# Patient Record
Sex: Female | Born: 1970 | Race: White | Hispanic: No | Marital: Single
Health system: Southern US, Community
[De-identification: ages and names within clinical notes are randomized; demographics above are authoritative.]

---

## 2014-09-26 ENCOUNTER — Encounter (HOSPITAL_COMMUNITY): Payer: Self-pay | Admitting: Emergency Medicine

## 2014-09-26 ENCOUNTER — Emergency Department (HOSPITAL_COMMUNITY)
Admission: EM | Admit: 2014-09-26 | Discharge: 2014-09-26 | Disposition: A | Discharge status: Home / Self Care | Payer: No Typology Code available for payment source | Source: Home / Self Care | Attending: Emergency Medicine | Admitting: Emergency Medicine

## 2014-09-26 DIAGNOSIS — J069 Acute upper respiratory infection, unspecified: Secondary | ICD-10-CM

## 2014-09-26 DIAGNOSIS — B9789 Other viral agents as the cause of diseases classified elsewhere: Principal | ICD-10-CM

## 2014-09-26 MED ORDER — AMOXICILLIN-POT CLAVULANATE 875-125 MG PO TABS: 1.0000 | ORAL_TABLET | Freq: Two times a day (BID) | ORAL | Status: AC

## 2014-09-26 MED ORDER — IPRATROPIUM BROMIDE 0.06 % NA SOLN: 2.0000 | Freq: Four times a day (QID) | NASAL | Status: AC

## 2014-09-26 MED ORDER — CETIRIZINE HCL 10 MG PO CAPS: 10.0000 mg | ORAL_CAPSULE | Freq: Every day | ORAL | Status: AC

## 2014-09-26 MED ORDER — FLUTICASONE PROPIONATE 50 MCG/ACT NA SUSP: 2.0000 | Freq: Every day | NASAL | Status: AC

## 2014-09-26 NOTE — ED Provider Notes (Signed)
CSN: [id number]     Arrival date & time 09/26/14  1534 History   First MD Initiated Contact with Patient 09/26/14 1631     Chief Complaint  Patient presents with  . Cough   (Consider location/radiation/quality/duration/timing/severity/associated sxs/prior Treatment) HPI She is a 43 year old woman here for evaluation of cough and sore throat. Her symptoms started about 4 days ago and has gradually worsened. She describes nasal congestion and rhinorrhea. Some mild sinus pressure. She reports feeling like her ears are draining, but denies ear pain. She prescription a sore throat, increased pain with swallowing. She is tolerating liquids well. She denies any shortness of breath, fevers, chills, chest pain. She states this typically turns into a sinus infection.  History reviewed. No pertinent past medical history. History reviewed. No pertinent past surgical history. No family history on file. History  Substance Use Topics  . Smoking status: Not on file  . Smokeless tobacco: Not on file  . Alcohol Use: Not on file   OB History    No data available     Review of Systems  Constitutional: Negative for fever and chills.  HENT: Positive for congestion, rhinorrhea, sinus pressure and sore throat. Negative for ear pain and trouble swallowing.   Respiratory: Positive for cough. Negative for shortness of breath.   Cardiovascular: Negative for chest pain.  Gastrointestinal: Negative for nausea, vomiting and abdominal pain.  Neurological: Negative for headaches.    Allergies  Review of patient's allergies indicates no known allergies.  Home Medications   Prior to Admission medications   Medication Sig Start Date End Date Taking? Authorizing Provider  amoxicillin-clavulanate (AUGMENTIN) 875-125 MG per tablet Take 1 tablet by mouth 2 (two) times daily. 09/26/14   Charm RingsErin J Honig, MD  Cetirizine HCl 10 MG CAPS Take 1 capsule (10 mg total) by mouth daily. 09/26/14   Charm RingsErin J Honig, MD  fluticasone  (FLONASE) 50 MCG/ACT nasal spray Place 2 sprays into both nostrils daily. 09/26/14   Charm RingsErin J Honig, MD  ipratropium (ATROVENT) 0.06 % nasal spray Place 2 sprays into both nostrils 4 (four) times daily. 09/26/14   Charm RingsErin J Honig, MD   BP 139/95 mmHg  Pulse 67  Temp(Src) 98.2 F (36.8 C) (Oral)  Resp 18  SpO2 97%  LMP 09/07/2014 Physical Exam  Constitutional: She is oriented to person, place, and time. She appears well-developed and well-nourished. No distress.  HENT:  Head: Normocephalic and atraumatic.  Right Ear: External ear normal.  Left Ear: External ear normal.  Nose: Mucosal edema and rhinorrhea present. Right sinus exhibits no maxillary sinus tenderness and no frontal sinus tenderness. Left sinus exhibits no maxillary sinus tenderness and no frontal sinus tenderness.  Mouth/Throat: Oropharynx is clear and moist. No oropharyngeal exudate.  Eyes: Conjunctivae are normal.  Neck: Neck supple.  Cardiovascular: Normal rate, regular rhythm and normal heart sounds.   No murmur heard. Pulmonary/Chest: Effort normal and breath sounds normal. No respiratory distress. She has no wheezes. She has no rales.  Lymphadenopathy:    She has no cervical adenopathy.  Neurological: She is alert and oriented to person, place, and time.    ED Course  Procedures (including critical care time) Labs Review Labs Reviewed - No data to display  Imaging Review No results found.   MDM   1. Viral URI with cough    We'll treat symptomatically with Zyrtec, Flonase, Atrovent nasal spray. Also recommended nasal saline spray. Prescription provided for Augmentin, to be filled only if she is not improving  by Saturday. Follow-up as needed.    Charm RingsErin J Honig, MD 09/26/14 [phone]

## 2014-09-26 NOTE — ED Notes (Signed)
[age] year old female here today with c/o congestion  Bad cough and bad sorethroat for past 4 days

## 2014-09-26 NOTE — Discharge Instructions (Signed)
You have a virus. Take cetirizine 1 pill daily for the next week. Use flonase daily for the next week. Use atrovent 4 times a day for the next week. If you are not improving by Saturday, fill the prescription for Augmentin (antibiotic). Follow up as needed.

## 2015-09-14 ENCOUNTER — Other Ambulatory Visit (HOSPITAL_COMMUNITY)
Admission: RE | Admit: 2015-09-14 | Discharge: 2015-09-14 | Disposition: A | Discharge status: Home / Self Care | Payer: 59 | Source: Ambulatory Visit | Attending: Family Medicine | Admitting: Family Medicine

## 2015-09-14 ENCOUNTER — Other Ambulatory Visit: Payer: Self-pay | Admitting: Family Medicine

## 2015-09-14 DIAGNOSIS — Z01419 Encounter for gynecological examination (general) (routine) without abnormal findings: Secondary | ICD-10-CM | POA: Insufficient documentation

## 2015-09-18 LAB — CYTOLOGY - PAP

## 2018-01-22 ENCOUNTER — Other Ambulatory Visit (HOSPITAL_COMMUNITY)
Admission: RE | Admit: 2018-01-22 | Discharge: 2018-01-22 | Disposition: A | Discharge status: Home / Self Care | Payer: BLUE CROSS/BLUE SHIELD | Source: Ambulatory Visit | Attending: Family Medicine | Admitting: Family Medicine

## 2018-01-22 ENCOUNTER — Other Ambulatory Visit: Payer: Self-pay | Admitting: Family Medicine

## 2018-01-22 DIAGNOSIS — Z0000 Encounter for general adult medical examination without abnormal findings: Secondary | ICD-10-CM | POA: Insufficient documentation

## 2018-01-27 LAB — CYTOLOGY - PAP
Diagnosis: NEGATIVE
HPV (WINDOPATH): NOT DETECTED

## 2021-07-22 ENCOUNTER — Other Ambulatory Visit: Payer: Self-pay | Admitting: Family Medicine

## 2021-07-22 ENCOUNTER — Other Ambulatory Visit: Payer: BLUE CROSS/BLUE SHIELD

## 2021-07-22 DIAGNOSIS — R52 Pain, unspecified: Secondary | ICD-10-CM

## 2021-07-23 ENCOUNTER — Ambulatory Visit
Admission: RE | Admit: 2021-07-23 | Discharge: 2021-07-23 | Disposition: A | Discharge status: Home / Self Care | Payer: Self-pay | Source: Ambulatory Visit | Attending: Family Medicine | Admitting: Family Medicine

## 2021-07-23 ENCOUNTER — Other Ambulatory Visit: Payer: Self-pay

## 2021-07-23 DIAGNOSIS — R52 Pain, unspecified: Secondary | ICD-10-CM

## 2022-06-28 IMAGING — CR DG KNEE 3 VIEWS*L*
3 series · 3 of 3 positions shown · non-contrast
Comparison: None.

CLINICAL DATA: Pain. Patient reports medial left knee pain for
several weeks.

EXAM:
LEFT KNEE - 3 VIEW

[w knee ap left]
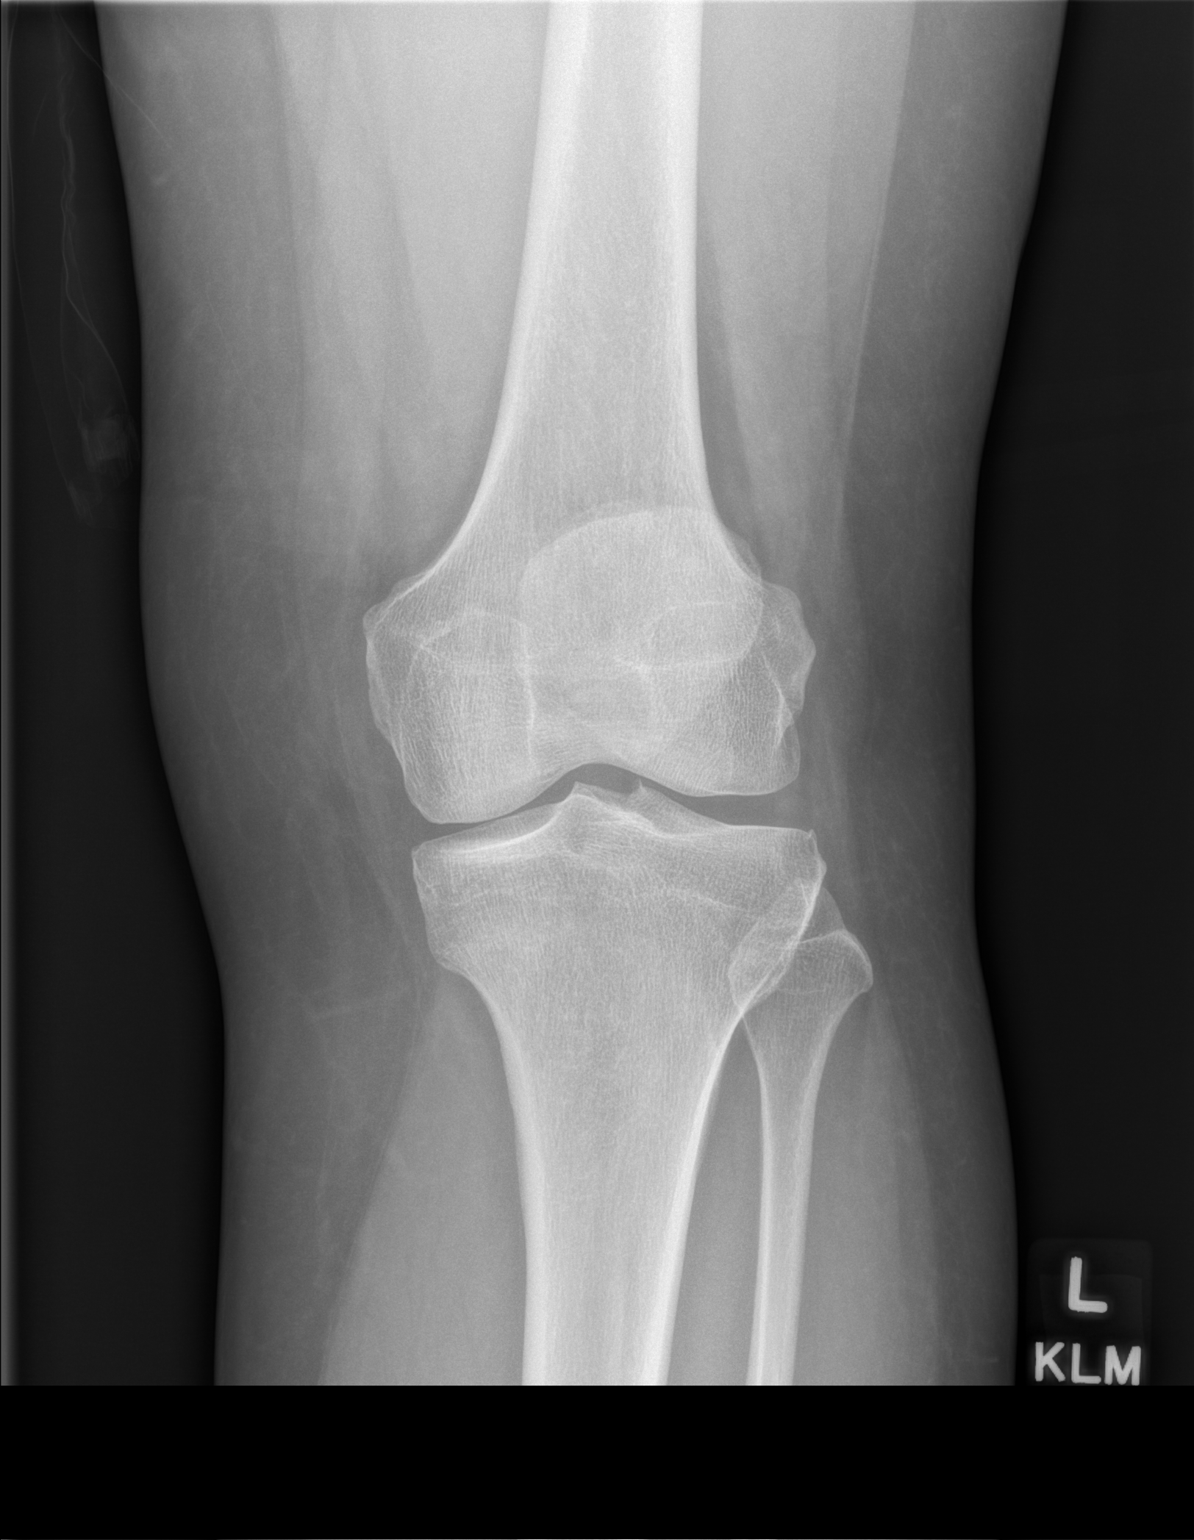

[w knee lat left]
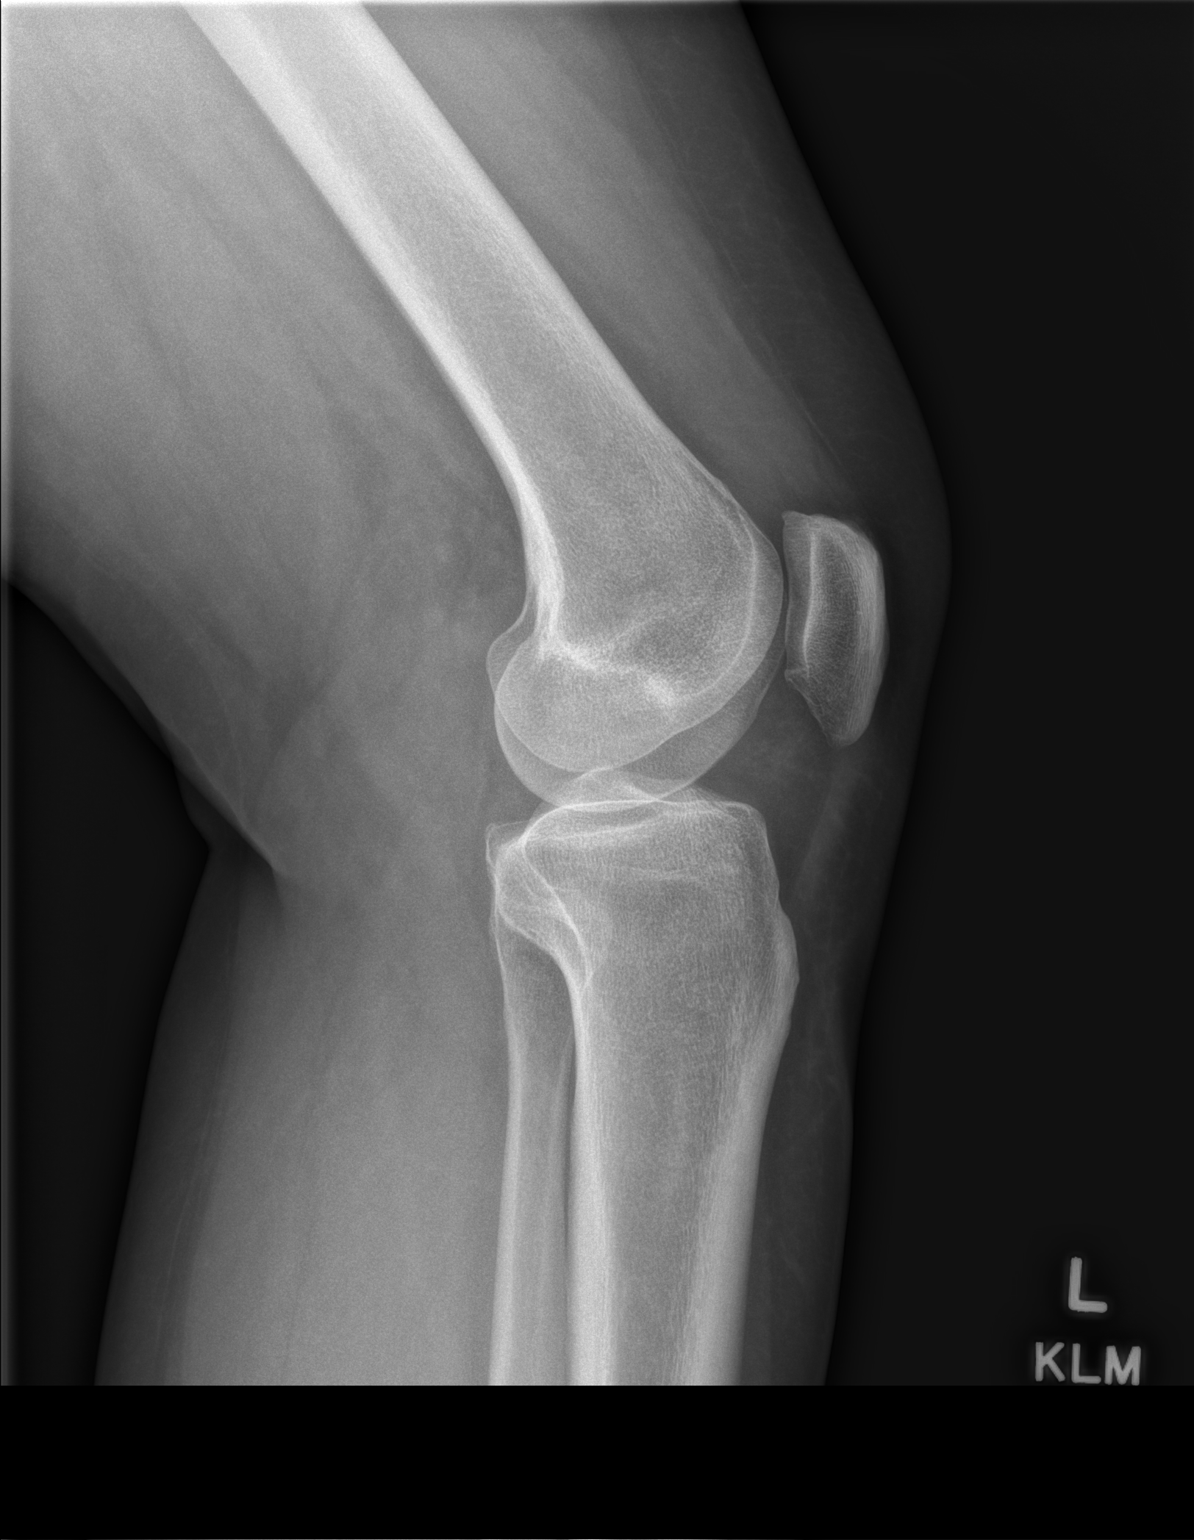

[x knee sunrise left]
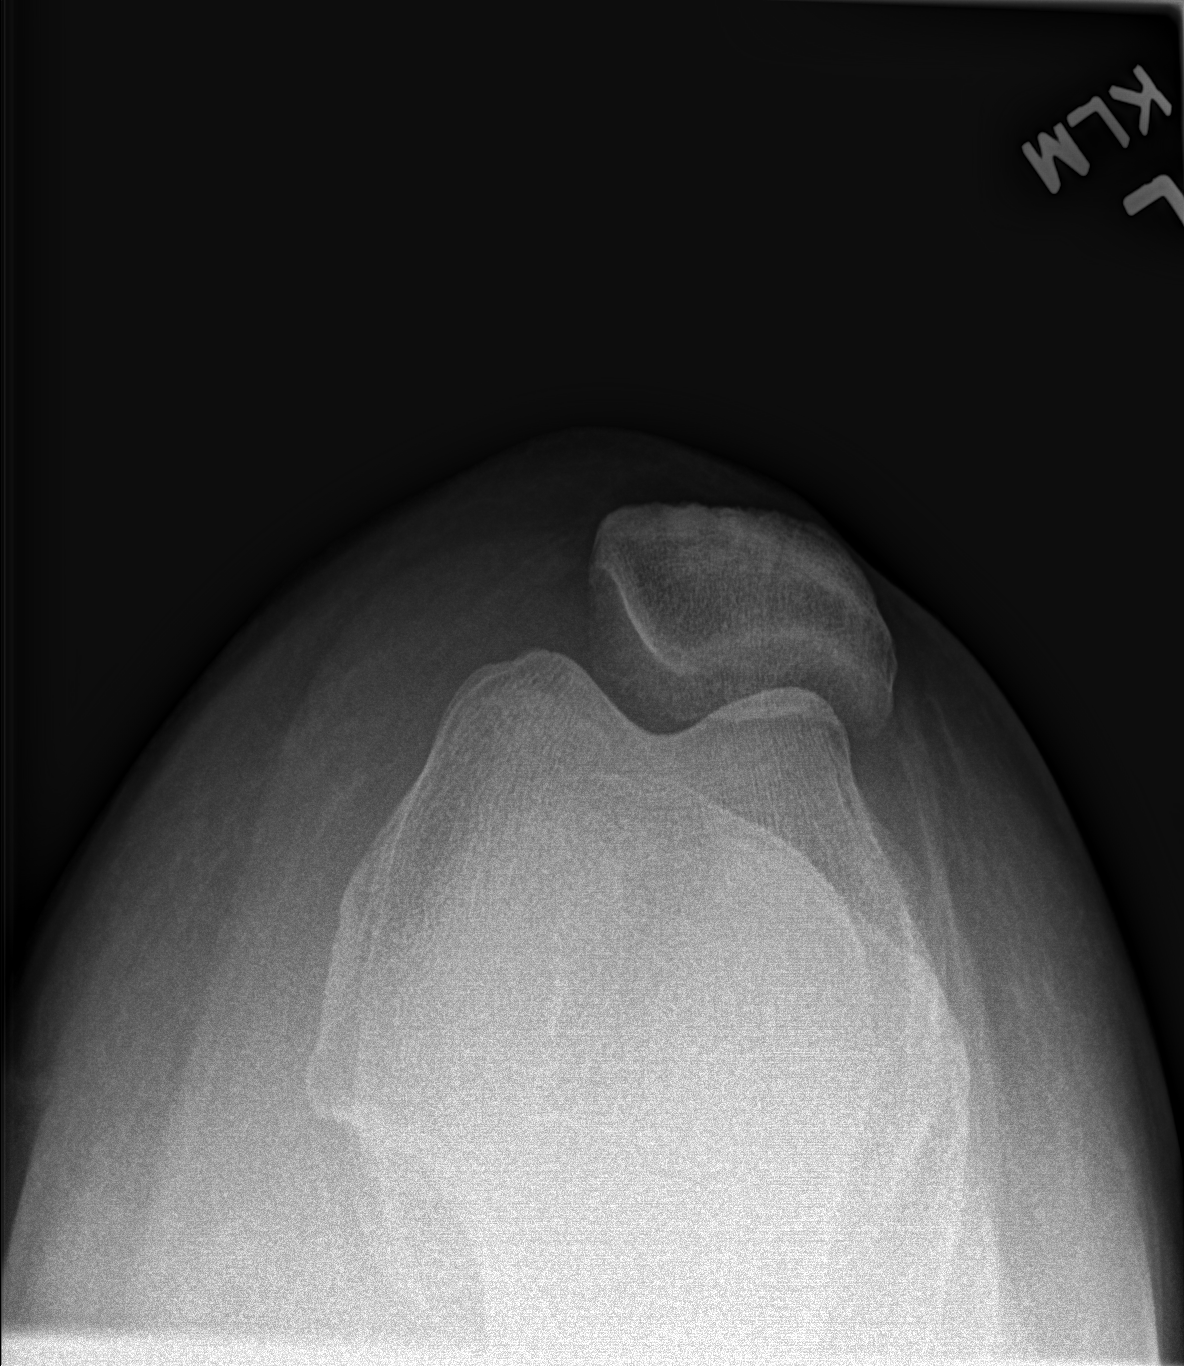

[3 of 3 positions shown; findings below may reference images not displayed]

FINDINGS: Standing AP and lateral as well as patellar sunrise views. There is
mild lateral patellar tilt. Otherwise normal alignment. Trace
tricompartmental peripheral spurring and minimal spurring of the
tibial spines. No fracture. No erosion or focal bone abnormality.
Small knee joint effusion.
IMPRESSION: 1. Minimal tricompartmental osteoarthritis with small joint
effusion.
2. Mild lateral patellar tilt.

## 2023-07-09 ENCOUNTER — Other Ambulatory Visit: Payer: Self-pay | Admitting: Family Medicine

## 2023-07-09 DIAGNOSIS — Z1231 Encounter for screening mammogram for malignant neoplasm of breast: Secondary | ICD-10-CM

## 2023-07-24 ENCOUNTER — Ambulatory Visit
Admission: RE | Admit: 2023-07-24 | Discharge: 2023-07-24 | Disposition: A | Discharge status: Home / Self Care | Payer: No Typology Code available for payment source | Source: Ambulatory Visit | Attending: Family Medicine | Admitting: Family Medicine

## 2023-07-24 DIAGNOSIS — Z1231 Encounter for screening mammogram for malignant neoplasm of breast: Secondary | ICD-10-CM

## 2024-11-05 ENCOUNTER — Other Ambulatory Visit: Payer: Self-pay

## 2024-11-05 ENCOUNTER — Encounter (HOSPITAL_BASED_OUTPATIENT_CLINIC_OR_DEPARTMENT_OTHER): Payer: Self-pay

## 2024-11-05 ENCOUNTER — Emergency Department (HOSPITAL_BASED_OUTPATIENT_CLINIC_OR_DEPARTMENT_OTHER)

## 2024-11-05 ENCOUNTER — Emergency Department (HOSPITAL_BASED_OUTPATIENT_CLINIC_OR_DEPARTMENT_OTHER)
Admission: EM | Admit: 2024-11-05 | Discharge: 2024-11-05 | Disposition: A | Discharge status: Home / Self Care | Source: Home / Self Care | Attending: Emergency Medicine | Admitting: Emergency Medicine

## 2024-11-05 DIAGNOSIS — M79604 Pain in right leg: Secondary | ICD-10-CM | POA: Diagnosis present

## 2024-11-05 DIAGNOSIS — M7121 Synovial cyst of popliteal space [Baker], right knee: Secondary | ICD-10-CM | POA: Insufficient documentation

## 2024-11-05 NOTE — Discharge Instructions (Signed)
 It was a pleasure taking care of you here today  As we discussed your ultrasound did not show evidence of a blood clot however did show a popliteal cyst.  Recommend compression, ice and elevation to the leg.  Follow-up with your primary care provider or orthopedic  Return for new or worsening symptoms such as redness to the leg, worsening pain, chest pain, shortness of breath, numbness or weakness

## 2024-11-05 NOTE — ED Triage Notes (Signed)
 Patient arrives with left calf pain. No injury, no hx of DVT. Does not smoke, and is not on birth control. Does report sedentary lifestyle. [facility] suggest she come here for DVT rule out.

## 2024-11-05 NOTE — ED Provider Notes (Signed)
 "  EMERGENCY DEPARTMENT AT [facility] Provider Note   CSN: [id number] Arrival date & time: 11/05/24  1530    Patient presents with: Leg Pain (Left)   Megan Sanford is a 54 y.o. female here for evaluation of right leg pain.  Seen by walk-in clinic who recommended coming here for ultrasound to rule out DVT.  Pain to right popliteal fossa area over the last week.  No redness or warmth.  No known injury or trauma.  Ambulatory here.  No chest pain, shortness of breath.  No numbness or weakness   HPI     Prior to Admission medications  Medication Sig Start Date End Date Taking? Authorizing Provider  amoxicillin -clavulanate (AUGMENTIN ) 875-125 MG per tablet Take 1 tablet by mouth 2 (two) times daily. 09/26/14   Diedra Rocky PARAS, MD  Cetirizine  HCl 10 MG CAPS Take 1 capsule (10 mg total) by mouth daily. 09/26/14   Diedra Rocky PARAS, MD  fluticasone  (FLONASE ) 50 MCG/ACT nasal spray Place 2 sprays into both nostrils daily. 09/26/14   Honig, Erin J, MD  ipratropium (ATROVENT ) 0.06 % nasal spray Place 2 sprays into both nostrils 4 (four) times daily. 09/26/14   Diedra Rocky PARAS, MD    Allergies: Patient has no known allergies.    Review of Systems  Constitutional: Negative.   HENT: Negative.    Respiratory: Negative.    Cardiovascular: Negative.   Gastrointestinal: Negative.   Genitourinary: Negative.   Musculoskeletal:        Right calf pain  Skin: Negative.   Neurological: Negative.   All other systems reviewed and are negative.   Updated Vital Signs BP (!) 140/98 (BP Location: Right Arm)   Pulse 84   Temp 98 F (36.7 C) (Oral)   Resp 16   Ht 5' 1 (1.549 m)   Wt 80.3 kg   SpO2 99%   BMI 33.44 kg/m   Physical Exam Vitals and nursing note reviewed.  Constitutional:      General: She is not in acute distress.    Appearance: She is well-developed. She is not ill-appearing.  HENT:     Head: Atraumatic.  Eyes:     Pupils: Pupils are equal, round, and reactive  to light.  Cardiovascular:     Rate and Rhythm: Normal rate.     Pulses: Normal pulses.          Dorsalis pedis pulses are 2+ on the right side and 2+ on the left side.  Pulmonary:     Effort: No respiratory distress.  Abdominal:     General: There is no distension.  Musculoskeletal:        General: Normal range of motion.     Cervical back: Normal range of motion.     Comments: No bony tenderness, full range of motion.  Compartments soft.  Tenderness to right popliteal fossa without overlying skin changes.  Skin:    General: Skin is warm and dry.  Neurological:     General: No focal deficit present.     Mental Status: She is alert.     Sensory: Sensation is intact.     Motor: Motor function is intact.     Gait: Gait is intact.  Psychiatric:        Mood and Affect: Mood normal.     (all labs ordered are listed, but only abnormal results are displayed) Labs Reviewed - No data to display  EKG: None  Radiology: US  Venous Img Lower Unilateral  Right (DVT) Result Date: 11/05/2024 EXAM: ULTRASOUND DUPLEX OF THE RIGHT LOWER EXTREMITY VEINS TECHNIQUE: Duplex ultrasound using B-mode/gray scaled imaging and Doppler spectral analysis and color flow was obtained of the deep venous structures of the right lower extremity. COMPARISON: None available. CLINICAL HISTORY: Right posterior knee and calf pain and swelling for 5 days. No known injury. FINDINGS: The common femoral vein, femoral vein, popliteal vein, and posterior tibial vein demonstrate normal compressibility with normal color flow and spectral analysis. Complex fluid collection in the right popliteal region measuring 13.4 x 1.3 x 3.7 cm consistent with popliteal cyst. IMPRESSION: 1. No evidence of DVT. 2. Complex fluid collection in the right popliteal region measuring 13.4 x 1.3 x 3.7 cm, consistent with a popliteal cyst. Electronically signed by: Elsie Gravely MD 11/05/2024 05:20 PM EST RP Workstation: [id number]     Procedures    Medications Ordered in the ED - No data to display  54 year old here for evaluation of right calf pain.  Noted about a week ago.  Pain to right popliteal fossa.  No chest pain or shortness of breath.  No history of PE or DVT.  No recent surgery, immobilization or malignancy.  No bony tenderness.  Compartments are soft.  Neurovascularly intact.  Plan on ultrasound.  No evidence of bacterial infectious process at this time.  Imaging personally viewed interpreted Ultrasound shows fluid collection right popliteal region consistent with popliteal cyst  Discussed results with patient.  Discussed compression, ice, elevation, follow-up with orthopedics.  She is agreeable.  Ambulatory here.  Low suspicion for occult fracture, dislocation, septic joint, gout, hemarthrosis, VTE, ischemia, necrotizing infection/bacterial infectious process.  The patient has been appropriately medically screened and/or stabilized in the ED. I have low suspicion for any other emergent medical condition which would require further screening, evaluation or treatment in the ED or require inpatient management.  Patient is hemodynamically stable and in no acute distress.  Patient able to ambulate in department prior to ED.  Evaluation does not show acute pathology that would require ongoing or additional emergent interventions while in the emergency department or further inpatient treatment.  I have discussed the diagnosis with the patient and answered all questions.  Pain is been managed while in the emergency department and patient has no further complaints prior to discharge.  Patient is comfortable with plan discussed in room and is stable for discharge at this time.  I have discussed strict return precautions for returning to the emergency department.  Patient was encouraged to follow-up with PCP/specialist refer to at discharge.                                    Medical Decision Making Amount and/or Complexity of Data  Reviewed External Data Reviewed: labs, radiology and notes. Radiology: ordered and independent interpretation performed. Decision-making details documented in ED Course.  Risk OTC drugs. Decision regarding hospitalization. Diagnosis or treatment significantly limited by social determinants of health.        Final diagnoses:  Popliteal cyst, right    ED Discharge Orders     None          Marynell Bies A, PA-C 11/05/24 2006    [place], DO 11/05/24 2311  "
